# Patient Record
Sex: Female | Born: 1965 | Race: White | Hispanic: No | Marital: Married | Smoking: Never smoker
Health system: Southern US, Community
[De-identification: ages and names within clinical notes are randomized; demographics above are authoritative.]

## PROBLEM LIST (undated history)

## (undated) DIAGNOSIS — E119 Type 2 diabetes mellitus without complications: Secondary | ICD-10-CM

## (undated) DIAGNOSIS — R7303 Prediabetes: Secondary | ICD-10-CM

## (undated) DIAGNOSIS — R109 Unspecified abdominal pain: Secondary | ICD-10-CM

## (undated) DIAGNOSIS — D239 Other benign neoplasm of skin, unspecified: Secondary | ICD-10-CM

## (undated) HISTORY — DX: Other benign neoplasm of skin, unspecified: D23.9

## (undated) HISTORY — PX: COLONOSCOPY: SHX174

---

## 2004-09-08 ENCOUNTER — Ambulatory Visit: Payer: Self-pay

## 2009-07-03 ENCOUNTER — Ambulatory Visit: Payer: Self-pay

## 2014-01-15 ENCOUNTER — Ambulatory Visit: Payer: Self-pay | Admitting: Gastroenterology

## 2017-04-13 ENCOUNTER — Ambulatory Visit
Admission: RE | Admit: 2017-04-13 | Discharge: 2017-04-13 | Disposition: A | Discharge status: Home / Self Care | Payer: BLUE CROSS/BLUE SHIELD | Source: Ambulatory Visit | Attending: Internal Medicine | Admitting: Internal Medicine

## 2017-04-13 ENCOUNTER — Other Ambulatory Visit: Payer: Self-pay | Admitting: Internal Medicine

## 2017-04-13 DIAGNOSIS — R1011 Right upper quadrant pain: Secondary | ICD-10-CM

## 2017-04-13 DIAGNOSIS — R932 Abnormal findings on diagnostic imaging of liver and biliary tract: Secondary | ICD-10-CM | POA: Insufficient documentation

## 2017-04-13 DIAGNOSIS — R109 Unspecified abdominal pain: Secondary | ICD-10-CM

## 2017-04-13 DIAGNOSIS — K8020 Calculus of gallbladder without cholecystitis without obstruction: Secondary | ICD-10-CM | POA: Insufficient documentation

## 2017-04-13 HISTORY — DX: Unspecified abdominal pain: R10.9

## 2017-04-13 MED ORDER — TECHNETIUM TC 99M MEBROFENIN IV KIT
5.0000 | PACK | Freq: Once | INTRAVENOUS | Status: AC | PRN
  Administered 2017-04-13: 5.564 via INTRAVENOUS

## 2017-04-15 ENCOUNTER — Ambulatory Visit
Admission: RE | Admit: 2017-04-15 | Discharge: 2017-04-15 | Disposition: A | Discharge status: Home / Self Care | Payer: BLUE CROSS/BLUE SHIELD | Source: Ambulatory Visit | Attending: General Surgery | Admitting: General Surgery

## 2017-04-15 ENCOUNTER — Other Ambulatory Visit: Payer: Self-pay | Admitting: General Surgery

## 2017-04-15 DIAGNOSIS — K8020 Calculus of gallbladder without cholecystitis without obstruction: Secondary | ICD-10-CM

## 2017-04-15 DIAGNOSIS — K8000 Calculus of gallbladder with acute cholecystitis without obstruction: Secondary | ICD-10-CM | POA: Diagnosis present

## 2017-04-15 DIAGNOSIS — R17 Unspecified jaundice: Secondary | ICD-10-CM | POA: Diagnosis present

## 2017-04-15 MED ORDER — GADOBENATE DIMEGLUMINE 529 MG/ML IV SOLN
20.0000 mL | Freq: Once | INTRAVENOUS | Status: AC | PRN
  Administered 2017-04-15: 18 mL via INTRAVENOUS

## 2017-04-18 HISTORY — PX: ERCP: SHX60

## 2017-04-19 ENCOUNTER — Ambulatory Visit: Payer: Self-pay | Admitting: General Surgery

## 2017-04-19 ENCOUNTER — Other Ambulatory Visit: Payer: Self-pay

## 2017-04-19 ENCOUNTER — Encounter
Admission: RE | Admit: 2017-04-19 | Discharge: 2017-04-19 | Disposition: A | Discharge status: Home / Self Care | Payer: BLUE CROSS/BLUE SHIELD | Source: Ambulatory Visit | Attending: General Surgery | Admitting: General Surgery

## 2017-04-19 HISTORY — DX: Prediabetes: R73.03

## 2017-04-19 MED ORDER — CEFAZOLIN SODIUM-DEXTROSE 2-4 GM/100ML-% IV SOLN
2.0000 g | INTRAVENOUS | Status: AC
  Administered 2017-04-20: 2 g via INTRAVENOUS
  Filled 2017-04-19: qty 100

## 2017-04-19 NOTE — H&P (Signed)
PATIENT PROFILE: Tiffany [name] is a 52 y.o. female who presents to the Clinic for consultation at the request of Dr. Sabra Heck for evaluation of cholelithiasis.  PCP:  Yevonne Pax, MD  HISTORY OF PRESENT ILLNESS: [name] reports had abdominal pain that started 5 days ago. The pain is localized on the epigastric area and does not radiates to other part of the body. Paid is aggravated by food intake (fatty food). Pain is improved with ibuprofen sometimes. Patient refers that she has been having this pain during the past 6 months but this last one was more severe. This time pain was associated with nausea. Denies having yellow sclera but refers dark urine. Denies fever or chills.    Patient was evaluated by me on 04/15/17 and was found with choledocholithiasis. ERCP was coordinated at [facility] due to unavailability of ERCP at [facility] on the weekend. The procedure was cancelled on Friday as coordinated and done on Monday 04/18/17. Patient has been doing well. ERCP was completed with extraction of stone and sphincterotomy and papillotomy.    PROBLEM LIST:         Problem List  Date Reviewed: 04/13/2017         Noted   B12 deficiency 11/01/2016   Overview    258, 2018         GENERAL REVIEW OF SYSTEMS:   General ROS: negative for - chills, fever, weight gain or weight loss. Positive for fatigue Allergy and Immunology ROS: negative for - hives  Hematological and Lymphatic ROS: negative for - bleeding problems or bruising, negative for palpable nodes Endocrine ROS: negative for - heat or cold intolerance, hair changes Respiratory ROS: negative for - cough, shortness of breath or wheezing Cardiovascular ROS: no chest pain or palpitations GI ROS: negative for , constipation, vomiting. Positive for nausea, abdominal pain, diarrhea  Musculoskeletal ROS: negative for - joint swelling or muscle pain Neurological ROS: negative for - confusion, syncope. Positive for dizziness.   Dermatological ROS: negative for pruritus and rash Psychiatric: negative for anxiety, depression, difficulty sleeping and memory loss  MEDICATIONS: CurrentMedications        Current Outpatient Medications  Medication Sig Dispense Refill  . pantoprazole (PROTONIX) 40 MG DR tablet Take 1 tablet (40 mg total) by mouth once daily 30 tablet 0   No current facility-administered medications for this visit.       ALLERGIES: Patient has no known allergies.  PAST MEDICAL HISTORY: History reviewed. No pertinent past medical history.  PAST SURGICAL HISTORY:      Past Surgical History:  Procedure Laterality Date  . CESAREAN SECTION     x1  . COLONOSCOPY  08/14/2002 DKS   DKS  Normal/Repeat at age 52  . COLONOSCOPY  01/15/2014   Normal/Colon/Repeat 52yr/PYO     FAMILY HISTORY:      Family History  Problem Relation Age of Onset  . Thyroid disease Mother   . High blood pressure (Hypertension) Father   . Diabetes Brother      SOCIAL HISTORY: Social History          Socioeconomic History  . Marital status: Single    Spouse name: Not on file  . Number of children: Not on file  . Years of education: Not on file  . Highest education level: Not on file  Occupational History  . Not on file  Social Needs  . Financial resource strain: Not on file  . Food insecurity:    Worry: Not on file  Inability: Not on file  . Transportation needs:    Medical: Not on file    Non-medical: Not on file  Tobacco Use  . Smoking status: Never Smoker  . Smokeless tobacco: Never Used  Substance and Sexual Activity  . Alcohol use: Not Currently    Alcohol/week: 0.0 oz    Comment: on occasion  . Drug use: Never  . Sexual activity: Not Currently    Partners: Male  Other Topics Concern  . Not on file  Social History Narrative  . Not on file      PHYSICAL EXAM:    Vitals:   04/15/17 1105  BP: 128/79  Pulse: 85  Temp: 36.5 C (97.7 F)    Body mass index is 33.13 kg/m. Weight: 87.5 kg (193 lb)   GENERAL: Alert, active, oriented x3  HEENT: Pupils equal reactive to light. Extraocular movements are intact. Sclera clear. Palpebral conjunctiva normal red color.Pharynx clear.  NECK: Supple with no palpable mass and no adenopathy.  LUNGS: Sound clear with no rales rhonchi or wheezes.  HEART: Regular rhythm S1 and S2 without murmur.  ABDOMEN: Soft and depressible, nontender with no palpable mass, no hepatomegaly.   EXTREMITIES: Well-developed well-nourished symmetrical with no dependent edema.  NEUROLOGICAL: Awake alert oriented, facial expression symmetrical, moving all extremities.  REVIEW OF DATA: I have reviewed the following data today:      Office Visit on 04/13/2017  Component Date Value  . WBC (White Blood Cell Co* 04/13/2017 6.0   . RBC (Red Blood Cell Coun* 04/13/2017 5.07   . Hemoglobin 04/13/2017 15.1*  . Hematocrit 04/13/2017 45.1   . MCV (Mean Corpuscular Vo* 04/13/2017 89.0   . MCH (Mean Corpuscular He* 04/13/2017 29.8   . MCHC (Mean Corpuscular H* 04/13/2017 33.5   . Platelet Count 04/13/2017 211   . RDW-CV (Red Cell Distrib* 04/13/2017 12.7   . MPV (Mean Platelet Volum* 04/13/2017 10.1   . Neutrophils 04/13/2017 4.38   . Lymphocytes 04/13/2017 1.01   . Monocytes 04/13/2017 0.54   . Eosinophils 04/13/2017 0.06   . Basophils 04/13/2017 0.01   . Neutrophil % 04/13/2017 72.7*  . Lymphocyte % 04/13/2017 16.8   . Monocyte % 04/13/2017 9.0   . Eosinophil % 04/13/2017 1.0   . Basophil% 04/13/2017 0.2   . Immature Granulocyte % 04/13/2017 0.3   . Immature Granulocyte Cou* 04/13/2017 0.02   . Glucose 04/13/2017 128*  . Sodium 04/13/2017 138   . Potassium 04/13/2017 4.0   . Chloride 04/13/2017 102   . Carbon Dioxide (CO2) 04/13/2017 28.4   . Urea Nitrogen (BUN) 04/13/2017 14   . Creatinine 04/13/2017 0.9   . Glomerular Filtration Ra* 04/13/2017 65   . Calcium 04/13/2017 9.8   . AST   04/13/2017 378*  . ALT  04/13/2017 317*  . Alk Phos (alkaline Phosp* 04/13/2017 163*  . Albumin 04/13/2017 4.7   . Bilirubin, Total 04/13/2017 2.3*  . Protein, Total 04/13/2017 7.3   . A/G Ratio 04/13/2017 1.8   . Color 04/13/2017 Yellow   . Clarity 04/13/2017 Clear   . Specific Gravity 04/13/2017 1.020   . pH, Urine 04/13/2017 6.0   . Protein, Urinalysis 04/13/2017 Negative   . Glucose, Urinalysis 04/13/2017 Negative   . Ketones, Urinalysis 04/13/2017 Negative   . Blood, Urinalysis 04/13/2017 Moderate*  . Nitrite, Urinalysis 04/13/2017 Negative   . Leukocyte Esterase, Urin* 04/13/2017 Trace*  . White Blood Cells, Urina* 04/13/2017 0-3   . Red Blood Cells, Urinaly* 04/13/2017 4-10*  .  Bacteria, Urinalysis 04/13/2017 Few*  . Squamous Epithelial Cell* 04/13/2017 Few   . Lipase 04/13/2017 13      ASSESSMENT: Ms. Hurley is a 52 y.o. female presenting for consultation for cholelithiasis. After complete evaluation of patient history, physical and chart, choledocholithiasis was highly suspected. Even though the HIDA scan was negative, this is not the best study to evaluate the biliary ducts. Early in the morning before the patient was seen, the MRCP was coordinated to be able to evaluate the patient with a complete workup. After the MRCP was done and choledocholithiasis was confirmed, I contacted Dr. Allen Norris who is not on call and told me that he was not going to be available during the weekend and the next available ERCP was going to be on Monday. Since he is the only GI in the Hospital that does ERCP multiple GI's were called until finally one at [facility] accepted the case.   Patient was oriented about the diagnosis of cholelithiasis and choledocholithiasis. Also oriented about what is the gallbladder, its anatomy and function and the implications of having stones and more specifically having stone on the common bile duct. After multiple attempts to find a gastroenterologist who performs ERCP,  finally he was found in [place] and the case was accepted to be done Friday 04/15/17 but was finally done on Monday 04/18/17. I oriented the patient that even she was in no pain at that moment, I personally did not feel comfortable sending the patient home and waiting to have the GI doctor to perform an ERCP on Monday, and even admitting a patient to the hospital for observation until Monday I consider that is not the best treatment possible for the patient since I don't have a GI that does ERCP in my hospital during this weekend if the patient starts to deteriorate. For that reason, the patient was send to a GI that accepted the case. I told the patient that if the cholecystectomy can be performed over there after the procedure, it will be good, but if it is not done over there, I will be available to perform the cholecystectomy. Since they were not able to the the cholecystectomy, I will perform the procedure.   Surgical technique (open vs laparoscopic) was discussed. It was also discussed the goals of the surgery (decrease the pain episodes and avoid the risk of cholecystitis) and the risk of surgery including: bleeding, infection, common bile duct injury, stone retention, injury to other organs such as bowel, liver, stomach, other complications such as hernia, bowel obstruction among others. Also discussed with patient about anesthesia and its complications such as: reaction to medications, pneumonia, heart complications, death, among others.   PLAN: 1. Laparoscopic cholecystectomy  2. ERCP done 04/18/17  Patient verbalized understanding, all questions were answered, and were agreeable with the plan outlined above.     Herbert Pun, MD  Electronically signed by Herbert Pun, MD

## 2017-04-19 NOTE — Patient Instructions (Signed)
  Your procedure is scheduled on: 04-20-17 [facility] Report to [place] @ 6 AM-PT NOTIFIED OF THIS   Remember: Instructions that are not followed completely may result in serious medical risk, up to and including death, or upon the discretion of your surgeon and anesthesiologist your surgery may need to be rescheduled.    _x___ 1. Do not eat food after midnight the night before your procedure. NO GUM OR CANDY AFTER MIDNIGHT.  You may drink clear liquids up to 2 hours before you are scheduled to arrive at the hospital for your procedure.  Do not drink clear liquids within 2 hours of your scheduled arrival to the hospital.  Clear liquids include  --Water or Apple juice without pulp  --Clear carbohydrate beverage such as ClearFast or Gatorade  --Black Coffee or Clear Tea (No milk, no creamers, do not add anything to the coffee or Tea     __x__ 2. No Alcohol for 24 hours before or after surgery.   __x__3. No Smoking or e-cigarettes for 24 prior to surgery.  Do not use any chewable tobacco products for at least 6 hour prior to surgery   ____  4. Bring all medications with you on the day of surgery if instructed.    __x__ 5. Notify your doctor if there is any change in your medical condition     (cold, fever, infections).    x___6. On the morning of surgery brush your teeth with toothpaste and water.  You may rinse your mouth with mouth wash if you wish.  Do not swallow any toothpaste or mouthwash.   Do not wear jewelry, make-up, hairpins, clips or nail polish.  Do not wear lotions, powders, or perfumes. You may wear deodorant.  Do not shave 48 hours prior to surgery. Men may shave face and neck.  Do not bring valuables to the hospital.    [facility] is not responsible for any belongings or valuables.               Contacts, dentures or bridgework may not be worn into surgery.  Leave your suitcase in the car. After surgery it may be brought to your room.  For patients  admitted to the hospital, discharge time is determined by your treatment team.  _  Patients discharged the day of surgery will not be allowed to drive home.  You will need someone to drive you home and stay with you the night of your procedure.     ____ Take anti-hypertensive listed below, cardiac, seizure, asthma, anti-reflux and psychiatric medicines. These include:  1. NONE  2.  3.  4.  5.  6.  ____Fleets enema or Magnesium Citrate as directed.   _x___ Use CHG Soap or sage wipes as directed on instruction sheet   ____ Use inhalers on the day of surgery and bring to hospital day of surgery  ____ Stop Metformin and Janumet 2 days prior to surgery.    ____ Take 1/2 of usual insulin dose the night before surgery and none on the morning surgery.   ____ Follow recommendations from Cardiologist, Pulmonologist or PCP regarding stopping Aspirin, Coumadin, Plavix ,Eliquis, Effient, or Pradaxa, and Pletal.  X____Stop Anti-inflammatories such as Advil, Aleve, Ibuprofen, Motrin, Naproxen, Naprosyn, Goodies powders or aspirin products NOW-OK to take Tylenol    ____ Stop supplements until after surgery.    ____ Bring C-Pap to the hospital.

## 2017-04-19 NOTE — H&P (View-Only) (Signed)
PATIENT PROFILE: Tiffany [name] is a 52 y.o. female who presents to the Clinic for consultation at the request of Dr. Sabra Heck for evaluation of cholelithiasis.  PCP:  Yevonne Pax, MD  HISTORY OF PRESENT ILLNESS: Tiffany [name] reports had abdominal pain that started 5 days ago. The pain is localized on the epigastric area and does not radiates to other part of the body. Paid is aggravated by food intake (fatty food). Pain is improved with ibuprofen sometimes. Patient refers that she has been having this pain during the past 6 months but this last one was more severe. This time pain was associated with nausea. Denies having yellow sclera but refers dark urine. Denies fever or chills.    Patient was evaluated by me on 04/15/17 and was found with choledocholithiasis. ERCP was coordinated at [facility] due to unavailability of ERCP at [facility] on the weekend. The procedure was cancelled on Friday as coordinated and done on Monday 04/18/17. Patient has been doing well. ERCP was completed with extraction of stone and sphincterotomy and papillotomy.    PROBLEM LIST:         Problem List  Date Reviewed: 04/13/2017         Noted   B12 deficiency 11/01/2016   Overview    258, 2018         GENERAL REVIEW OF SYSTEMS:   General ROS: negative for - chills, fever, weight gain or weight loss. Positive for fatigue Allergy and Immunology ROS: negative for - hives  Hematological and Lymphatic ROS: negative for - bleeding problems or bruising, negative for palpable nodes Endocrine ROS: negative for - heat or cold intolerance, hair changes Respiratory ROS: negative for - cough, shortness of breath or wheezing Cardiovascular ROS: no chest pain or palpitations GI ROS: negative for , constipation, vomiting. Positive for nausea, abdominal pain, diarrhea  Musculoskeletal ROS: negative for - joint swelling or muscle pain Neurological ROS: negative for - confusion, syncope. Positive for dizziness.   Dermatological ROS: negative for pruritus and rash Psychiatric: negative for anxiety, depression, difficulty sleeping and memory loss  MEDICATIONS: CurrentMedications        Current Outpatient Medications  Medication Sig Dispense Refill  . pantoprazole (PROTONIX) 40 MG DR tablet Take 1 tablet (40 mg total) by mouth once daily 30 tablet 0   No current facility-administered medications for this visit.       ALLERGIES: Patient has no known allergies.  PAST MEDICAL HISTORY: History reviewed. No pertinent past medical history.  PAST SURGICAL HISTORY:      Past Surgical History:  Procedure Laterality Date  . CESAREAN SECTION     x1  . COLONOSCOPY  08/14/2002 DKS   DKS  Normal/Repeat at age 58  . COLONOSCOPY  01/15/2014   Normal/Colon/Repeat 21yr/PYO     FAMILY HISTORY:      Family History  Problem Relation Age of Onset  . Thyroid disease Mother   . High blood pressure (Hypertension) Father   . Diabetes Brother      SOCIAL HISTORY: Social History          Socioeconomic History  . Marital status: Single    Spouse name: Not on file  . Number of children: Not on file  . Years of education: Not on file  . Highest education level: Not on file  Occupational History  . Not on file  Social Needs  . Financial resource strain: Not on file  . Food insecurity:    Worry: Not on file  Inability: Not on file  . Transportation needs:    Medical: Not on file    Non-medical: Not on file  Tobacco Use  . Smoking status: Never Smoker  . Smokeless tobacco: Never Used  Substance and Sexual Activity  . Alcohol use: Not Currently    Alcohol/week: 0.0 oz    Comment: on occasion  . Drug use: Never  . Sexual activity: Not Currently    Partners: Male  Other Topics Concern  . Not on file  Social History Narrative  . Not on file      PHYSICAL EXAM:    Vitals:   04/15/17 1105  BP: 128/79  Pulse: 85  Temp: 36.5 C (97.7 F)    Body mass index is 33.13 kg/m. Weight: 87.5 kg (193 lb)   GENERAL: Alert, active, oriented x3  HEENT: Pupils equal reactive to light. Extraocular movements are intact. Sclera clear. Palpebral conjunctiva normal red color.Pharynx clear.  NECK: Supple with no palpable mass and no adenopathy.  LUNGS: Sound clear with no rales rhonchi or wheezes.  HEART: Regular rhythm S1 and S2 without murmur.  ABDOMEN: Soft and depressible, nontender with no palpable mass, no hepatomegaly.   EXTREMITIES: Well-developed well-nourished symmetrical with no dependent edema.  NEUROLOGICAL: Awake alert oriented, facial expression symmetrical, moving all extremities.  REVIEW OF DATA: I have reviewed the following data today:      Office Visit on 04/13/2017  Component Date Value  . WBC (White Blood Cell Co* 04/13/2017 6.0   . RBC (Red Blood Cell Coun* 04/13/2017 5.07   . Hemoglobin 04/13/2017 15.1*  . Hematocrit 04/13/2017 45.1   . MCV (Mean Corpuscular Vo* 04/13/2017 89.0   . MCH (Mean Corpuscular He* 04/13/2017 29.8   . MCHC (Mean Corpuscular H* 04/13/2017 33.5   . Platelet Count 04/13/2017 211   . RDW-CV (Red Cell Distrib* 04/13/2017 12.7   . MPV (Mean Platelet Volum* 04/13/2017 10.1   . Neutrophils 04/13/2017 4.38   . Lymphocytes 04/13/2017 1.01   . Monocytes 04/13/2017 0.54   . Eosinophils 04/13/2017 0.06   . Basophils 04/13/2017 0.01   . Neutrophil % 04/13/2017 72.7*  . Lymphocyte % 04/13/2017 16.8   . Monocyte % 04/13/2017 9.0   . Eosinophil % 04/13/2017 1.0   . Basophil% 04/13/2017 0.2   . Immature Granulocyte % 04/13/2017 0.3   . Immature Granulocyte Cou* 04/13/2017 0.02   . Glucose 04/13/2017 128*  . Sodium 04/13/2017 138   . Potassium 04/13/2017 4.0   . Chloride 04/13/2017 102   . Carbon Dioxide (CO2) 04/13/2017 28.4   . Urea Nitrogen (BUN) 04/13/2017 14   . Creatinine 04/13/2017 0.9   . Glomerular Filtration Ra* 04/13/2017 65   . Calcium 04/13/2017 9.8   . AST   04/13/2017 378*  . ALT  04/13/2017 317*  . Alk Phos (alkaline Phosp* 04/13/2017 163*  . Albumin 04/13/2017 4.7   . Bilirubin, Total 04/13/2017 2.3*  . Protein, Total 04/13/2017 7.3   . A/G Ratio 04/13/2017 1.8   . Color 04/13/2017 Yellow   . Clarity 04/13/2017 Clear   . Specific Gravity 04/13/2017 1.020   . pH, Urine 04/13/2017 6.0   . Protein, Urinalysis 04/13/2017 Negative   . Glucose, Urinalysis 04/13/2017 Negative   . Ketones, Urinalysis 04/13/2017 Negative   . Blood, Urinalysis 04/13/2017 Moderate*  . Nitrite, Urinalysis 04/13/2017 Negative   . Leukocyte Esterase, Urin* 04/13/2017 Trace*  . White Blood Cells, Urina* 04/13/2017 0-3   . Red Blood Cells, Urinaly* 04/13/2017 4-10*  .  Bacteria, Urinalysis 04/13/2017 Few*  . Squamous Epithelial Cell* 04/13/2017 Few   . Lipase 04/13/2017 13      ASSESSMENT: Tiffany [name] is a 52 y.o. female presenting for consultation for cholelithiasis. After complete evaluation of patient history, physical and chart, choledocholithiasis was highly suspected. Even though the HIDA scan was negative, this is not the best study to evaluate the biliary ducts. Early in the morning before the patient was seen, the MRCP was coordinated to be able to evaluate the patient with a complete workup. After the MRCP was done and choledocholithiasis was confirmed, I contacted Dr. Allen Norris who is not on call and told me that he was not going to be available during the weekend and the next available ERCP was going to be on Monday. Since he is the only GI in the Hospital that does ERCP multiple GI's were called until finally one at [facility] accepted the case.   Patient was oriented about the diagnosis of cholelithiasis and choledocholithiasis. Also oriented about what is the gallbladder, its anatomy and function and the implications of having stones and more specifically having stone on the common bile duct. After multiple attempts to find a gastroenterologist who performs ERCP,  finally he was found in [place] and the case was accepted to be done Friday 04/15/17 but was finally done on Monday 04/18/17. I oriented the patient that even she was in no pain at that moment, I personally did not feel comfortable sending the patient home and waiting to have the GI doctor to perform an ERCP on Monday, and even admitting a patient to the hospital for observation until Monday I consider that is not the best treatment possible for the patient since I don't have a GI that does ERCP in my hospital during this weekend if the patient starts to deteriorate. For that reason, the patient was send to a GI that accepted the case. I told the patient that if the cholecystectomy can be performed over there after the procedure, it will be good, but if it is not done over there, I will be available to perform the cholecystectomy. Since they were not able to the the cholecystectomy, I will perform the procedure.   Surgical technique (open vs laparoscopic) was discussed. It was also discussed the goals of the surgery (decrease the pain episodes and avoid the risk of cholecystitis) and the risk of surgery including: bleeding, infection, common bile duct injury, stone retention, injury to other organs such as bowel, liver, stomach, other complications such as hernia, bowel obstruction among others. Also discussed with patient about anesthesia and its complications such as: reaction to medications, pneumonia, heart complications, death, among others.   PLAN: 1. Laparoscopic cholecystectomy  2. ERCP done 04/18/17  Patient verbalized understanding, all questions were answered, and were agreeable with the plan outlined above.     Herbert Pun, MD  Electronically signed by Herbert Pun, MD

## 2017-04-20 ENCOUNTER — Other Ambulatory Visit: Payer: Self-pay

## 2017-04-20 ENCOUNTER — Ambulatory Visit: Payer: BLUE CROSS/BLUE SHIELD | Admitting: Anesthesiology

## 2017-04-20 ENCOUNTER — Encounter
Admission: RE | Disposition: A | Discharge status: Home / Self Care | Payer: Self-pay | Source: Ambulatory Visit | Attending: General Surgery

## 2017-04-20 ENCOUNTER — Ambulatory Visit
Admission: RE | Admit: 2017-04-20 | Discharge: 2017-04-20 | Disposition: A | Discharge status: Home / Self Care | Payer: BLUE CROSS/BLUE SHIELD | Source: Ambulatory Visit | Attending: General Surgery | Admitting: General Surgery

## 2017-04-20 ENCOUNTER — Encounter: Payer: Self-pay | Admitting: *Deleted

## 2017-04-20 DIAGNOSIS — E538 Deficiency of other specified B group vitamins: Secondary | ICD-10-CM | POA: Diagnosis not present

## 2017-04-20 DIAGNOSIS — K8010 Calculus of gallbladder with chronic cholecystitis without obstruction: Secondary | ICD-10-CM | POA: Insufficient documentation

## 2017-04-20 HISTORY — PX: CHOLECYSTECTOMY: SHX55

## 2017-04-20 LAB — POCT PREGNANCY, URINE: PREG TEST UR: NEGATIVE

## 2017-04-20 LAB — GLUCOSE, CAPILLARY
GLUCOSE-CAPILLARY: 189 mg/dL — AB (ref 65–99)
Glucose-Capillary: 141 mg/dL — ABNORMAL HIGH (ref 65–99)

## 2017-04-20 SURGERY — LAPAROSCOPIC CHOLECYSTECTOMY
Anesthesia: General | Site: Abdomen | Wound class: Clean Contaminated

## 2017-04-20 MED ORDER — KETOROLAC TROMETHAMINE 30 MG/ML IJ SOLN
INTRAMUSCULAR | Status: AC
  Filled 2017-04-20: qty 1

## 2017-04-20 MED ORDER — BUPIVACAINE-EPINEPHRINE (PF) 0.5% -1:200000 IJ SOLN
INTRAMUSCULAR | Status: AC
  Filled 2017-04-20: qty 30

## 2017-04-20 MED ORDER — SODIUM CHLORIDE 0.9 % IV SOLN
INTRAVENOUS | Status: DC
  Administered 2017-04-20: 07:00:00 via INTRAVENOUS

## 2017-04-20 MED ORDER — CEFAZOLIN SODIUM-DEXTROSE 2-3 GM-%(50ML) IV SOLR
INTRAVENOUS | Status: AC
  Filled 2017-04-20: qty 50

## 2017-04-20 MED ORDER — PHENYLEPHRINE HCL 10 MG/ML IJ SOLN
INTRAMUSCULAR | Status: AC
  Filled 2017-04-20: qty 1

## 2017-04-20 MED ORDER — FENTANYL CITRATE (PF) 100 MCG/2ML IJ SOLN: 25.0000 ug | INTRAMUSCULAR | Status: DC | PRN

## 2017-04-20 MED ORDER — SUGAMMADEX SODIUM 200 MG/2ML IV SOLN
INTRAVENOUS | Status: DC | PRN
  Administered 2017-04-20: 174.2 mg via INTRAVENOUS

## 2017-04-20 MED ORDER — GLYCOPYRROLATE 0.2 MG/ML IJ SOLN
INTRAMUSCULAR | Status: DC | PRN
  Administered 2017-04-20: 0.2 mg via INTRAVENOUS

## 2017-04-20 MED ORDER — DEXMEDETOMIDINE HCL 200 MCG/2ML IV SOLN
INTRAVENOUS | Status: DC | PRN
  Administered 2017-04-20: 8 ug via INTRAVENOUS

## 2017-04-20 MED ORDER — FENTANYL CITRATE (PF) 100 MCG/2ML IJ SOLN
INTRAMUSCULAR | Status: DC | PRN
  Administered 2017-04-20: 100 ug via INTRAVENOUS

## 2017-04-20 MED ORDER — ONDANSETRON HCL 4 MG/2ML IJ SOLN
INTRAMUSCULAR | Status: DC | PRN
  Administered 2017-04-20: 4 mg via INTRAVENOUS

## 2017-04-20 MED ORDER — ROCURONIUM BROMIDE 100 MG/10ML IV SOLN
INTRAVENOUS | Status: DC | PRN
  Administered 2017-04-20: 30 mg via INTRAVENOUS

## 2017-04-20 MED ORDER — OXYCODONE HCL 5 MG/5ML PO SOLN: 5.0000 mg | Freq: Once | ORAL | Status: AC | PRN

## 2017-04-20 MED ORDER — GLYCOPYRROLATE 0.2 MG/ML IJ SOLN
INTRAMUSCULAR | Status: AC
  Filled 2017-04-20: qty 1

## 2017-04-20 MED ORDER — DIPHENHYDRAMINE HCL 50 MG/ML IJ SOLN
INTRAMUSCULAR | Status: AC
  Filled 2017-04-20: qty 1

## 2017-04-20 MED ORDER — FENTANYL CITRATE (PF) 100 MCG/2ML IJ SOLN
INTRAMUSCULAR | Status: AC
  Filled 2017-04-20: qty 2

## 2017-04-20 MED ORDER — LIDOCAINE HCL (PF) 2 % IJ SOLN
INTRAMUSCULAR | Status: AC
  Filled 2017-04-20: qty 10

## 2017-04-20 MED ORDER — LACTATED RINGERS IV SOLN: INTRAVENOUS | Status: DC

## 2017-04-20 MED ORDER — SUCCINYLCHOLINE CHLORIDE 20 MG/ML IJ SOLN
INTRAMUSCULAR | Status: AC
  Filled 2017-04-20: qty 1

## 2017-04-20 MED ORDER — OXYCODONE HCL 5 MG PO TABS
5.0000 mg | ORAL_TABLET | Freq: Once | ORAL | Status: AC | PRN
  Administered 2017-04-20: 5 mg via ORAL

## 2017-04-20 MED ORDER — PROPOFOL 10 MG/ML IV BOLUS
INTRAVENOUS | Status: DC | PRN
  Administered 2017-04-20: 200 mg via INTRAVENOUS

## 2017-04-20 MED ORDER — FAMOTIDINE 20 MG PO TABS
20.0000 mg | ORAL_TABLET | Freq: Once | ORAL | Status: AC
  Administered 2017-04-20: 20 mg via ORAL

## 2017-04-20 MED ORDER — MIDAZOLAM HCL 5 MG/5ML IJ SOLN
INTRAMUSCULAR | Status: DC | PRN
  Administered 2017-04-20: 2 mg via INTRAVENOUS

## 2017-04-20 MED ORDER — MIDAZOLAM HCL 2 MG/2ML IJ SOLN
INTRAMUSCULAR | Status: AC
  Filled 2017-04-20: qty 2

## 2017-04-20 MED ORDER — DEXAMETHASONE SODIUM PHOSPHATE 10 MG/ML IJ SOLN
INTRAMUSCULAR | Status: AC
  Filled 2017-04-20: qty 1

## 2017-04-20 MED ORDER — LIDOCAINE HCL (CARDIAC) 20 MG/ML IV SOLN
INTRAVENOUS | Status: DC | PRN
  Administered 2017-04-20: 100 mg via INTRAVENOUS

## 2017-04-20 MED ORDER — DIPHENHYDRAMINE HCL 50 MG/ML IJ SOLN
INTRAMUSCULAR | Status: DC | PRN
  Administered 2017-04-20: 12.5 mg via INTRAVENOUS

## 2017-04-20 MED ORDER — OXYCODONE HCL 5 MG PO TABS
ORAL_TABLET | ORAL | Status: AC
  Filled 2017-04-20: qty 1

## 2017-04-20 MED ORDER — KETOROLAC TROMETHAMINE 30 MG/ML IJ SOLN
INTRAMUSCULAR | Status: DC | PRN
  Administered 2017-04-20: 30 mg via INTRAVENOUS

## 2017-04-20 MED ORDER — ROCURONIUM BROMIDE 50 MG/5ML IV SOLN
INTRAVENOUS | Status: AC
  Filled 2017-04-20: qty 1

## 2017-04-20 MED ORDER — HYDROCODONE-ACETAMINOPHEN 5-325 MG PO TABS
1.0000 | ORAL_TABLET | ORAL | 0 refills | Status: AC | PRN
Start: 2017-04-20 — End: 2017-04-23

## 2017-04-20 MED ORDER — ONDANSETRON HCL 4 MG/2ML IJ SOLN
INTRAMUSCULAR | Status: AC
  Filled 2017-04-20: qty 2

## 2017-04-20 MED ORDER — BUPIVACAINE-EPINEPHRINE (PF) 0.5% -1:200000 IJ SOLN
INTRAMUSCULAR | Status: DC | PRN
  Administered 2017-04-20: 15 mL via PERINEURAL

## 2017-04-20 MED ORDER — PROPOFOL 10 MG/ML IV BOLUS
INTRAVENOUS | Status: AC
  Filled 2017-04-20: qty 40

## 2017-04-20 MED ORDER — PHENYLEPHRINE HCL 10 MG/ML IJ SOLN
INTRAMUSCULAR | Status: DC | PRN
  Administered 2017-04-20: 50 ug via INTRAVENOUS
  Administered 2017-04-20 (×2): 100 ug via INTRAVENOUS
  Administered 2017-04-20: 150 ug via INTRAVENOUS
  Administered 2017-04-20 (×3): 100 ug via INTRAVENOUS

## 2017-04-20 MED ORDER — FAMOTIDINE 20 MG PO TABS
ORAL_TABLET | ORAL | Status: AC
  Administered 2017-04-20: 20 mg via ORAL
  Filled 2017-04-20: qty 1

## 2017-04-20 SURGICAL SUPPLY — 41 items
APPLIER CLIP 5 13 M/L LIGAMAX5 (MISCELLANEOUS) ×3
APPLIER CLIP LOGIC TI 5 (MISCELLANEOUS) IMPLANT
BLADE SURG SZ11 CARB STEEL (BLADE) ×3 IMPLANT
CANISTER SUCT 1200ML W/VALVE (MISCELLANEOUS) ×3 IMPLANT
CANISTER SUCT 3000ML (MISCELLANEOUS) ×3 IMPLANT
CHLORAPREP W/TINT 26ML (MISCELLANEOUS) ×3 IMPLANT
CLIP APPLIE 5 13 M/L LIGAMAX5 (MISCELLANEOUS) ×1 IMPLANT
DERMABOND ADVANCED (GAUZE/BANDAGES/DRESSINGS) ×2
DERMABOND ADVANCED .7 DNX12 (GAUZE/BANDAGES/DRESSINGS) ×1 IMPLANT
DRSG TEGADERM 2-3/8X2-3/4 SM (GAUZE/BANDAGES/DRESSINGS) ×3 IMPLANT
DRSG TEGADERM 4X4.75 (GAUZE/BANDAGES/DRESSINGS) ×3 IMPLANT
ELECT REM PT RETURN 9FT ADLT (ELECTROSURGICAL) ×3
ELECTRODE REM PT RTRN 9FT ADLT (ELECTROSURGICAL) ×1 IMPLANT
GLOVE BIO SURGEON STRL SZ 6.5 (GLOVE) ×8 IMPLANT
GLOVE BIO SURGEONS STRL SZ 6.5 (GLOVE) ×4
GOWN STRL REUS W/ TWL LRG LVL3 (GOWN DISPOSABLE) ×4 IMPLANT
GOWN STRL REUS W/TWL LRG LVL3 (GOWN DISPOSABLE) ×8
GRASPER SUT TROCAR 14GX15 (MISCELLANEOUS) ×3 IMPLANT
HEMOSTAT SURGICEL 2X3 (HEMOSTASIS) IMPLANT
IRRIGATION STRYKERFLOW (MISCELLANEOUS) ×1 IMPLANT
IRRIGATOR STRYKERFLOW (MISCELLANEOUS) ×3
IV NS 1000ML (IV SOLUTION) ×4
IV NS 1000ML BAXH (IV SOLUTION) ×2 IMPLANT
KIT TURNOVER KIT A (KITS) ×3 IMPLANT
LABEL OR SOLS (LABEL) ×3 IMPLANT
NEEDLE HYPO 25X1 1.5 SAFETY (NEEDLE) ×3 IMPLANT
NEEDLE INSUFFLATION 14GA 120MM (NEEDLE) ×3 IMPLANT
NS IRRIG 500ML POUR BTL (IV SOLUTION) ×3 IMPLANT
PACK LAP CHOLECYSTECTOMY (MISCELLANEOUS) ×3 IMPLANT
PENCIL ELECTRO HAND CTR (MISCELLANEOUS) ×3 IMPLANT
POUCH SPECIMEN RETRIEVAL 10MM (ENDOMECHANICALS) ×3 IMPLANT
SCISSORS METZENBAUM CVD 33 (INSTRUMENTS) ×3 IMPLANT
SLEEVE ENDOPATH XCEL 5M (ENDOMECHANICALS) ×6 IMPLANT
SPONGE GAUZE 2X2 8PLY STER LF (GAUZE/BANDAGES/DRESSINGS) ×1
SPONGE GAUZE 2X2 8PLY STRL LF (GAUZE/BANDAGES/DRESSINGS) ×2 IMPLANT
SUT MNCRL AB 4-0 PS2 18 (SUTURE) ×6 IMPLANT
SUT VIC AB 0 CT1 36 (SUTURE) ×3 IMPLANT
SUT VICRYL 0 AB UR-6 (SUTURE) ×3 IMPLANT
TROCAR XCEL NON-BLD 11X100MML (ENDOMECHANICALS) ×3 IMPLANT
TROCAR XCEL NON-BLD 5MMX100MML (ENDOMECHANICALS) ×3 IMPLANT
TUBING INSUFFLATION (TUBING) ×3 IMPLANT

## 2017-04-20 NOTE — Brief Op Note (Signed)
04/20/2017  9:52 AM  PATIENT:  Tiffany [name]  52 y.o. female  PRE-OPERATIVE DIAGNOSIS:  CALCULUS OF GALLBLADDER AND HISTORY OF CHOLEDOCHOLITHIASIS  [name]-OPERATIVE DIAGNOSIS:  CALCULUS OF GALLBLADDER AND HISTORY OF CHOLEDOCHOLITHIASIS  PROCEDURE:  Procedure(s): LAPAROSCOPIC CHOLECYSTECTOMY (N/A)  SURGEON:  Surgeon(s) and Role:    * Herbert Pun, MD - Primary  ANESTHESIA:   local and general  EBL:  15 mL

## 2017-04-20 NOTE — Interval H&P Note (Signed)
History and Physical Interval Note:  04/20/2017 6:46 AM  [name]  has presented today for surgery, with the diagnosis of CALCULUS OF GALLBLADDER, status post ERCP due to choledocholithiasis.  The various methods of treatment have been discussed with the patient and family. After consideration of risks, benefits and other options for treatment, the patient has consented to  Procedure(s): LAPAROSCOPIC CHOLECYSTECTOMY (N/A) as a surgical intervention .  The patient's history has been reviewed, patient examined, no change in status, stable for surgery.  I have reviewed the patient's chart and labs.  Questions were answered to the patient's satisfaction.     Tiffany [name]

## 2017-04-20 NOTE — OR Nursing (Signed)
Discussed discharge instructions with pt and family. All voice understanding.

## 2017-04-20 NOTE — Transfer of Care (Signed)
Immediate Anesthesia Transfer of Care Note  Patient: Tiffany [name]  Procedure(s) Performed: LAPAROSCOPIC CHOLECYSTECTOMY (N/A Abdomen)  Patient Location: PACU  Anesthesia Type:General  Level of Consciousness: awake, alert  and oriented  Airway & Oxygen Therapy: Patient Spontanous Breathing and Patient connected to face mask oxygen  Post-op Assessment: Report given to RN and Post -op Vital signs reviewed and stable  Post vital signs: Reviewed and stable  Last Vitals:  Vitals Value Taken Time  BP 135/82 04/20/2017  9:31 AM  Temp 37.1 C 04/20/2017  9:31 AM  Pulse 93 04/20/2017  9:31 AM  Resp 19 04/20/2017  9:31 AM  SpO2 100 % 04/20/2017  9:31 AM  Vitals shown include unvalidated device data.  Last Pain:  Vitals:   04/20/17 0605  TempSrc: Oral         Complications: No apparent anesthesia complications

## 2017-04-20 NOTE — Anesthesia Preprocedure Evaluation (Signed)
Anesthesia Evaluation  Patient identified by MRN, date of birth, ID band Patient awake    Reviewed: Allergy & Precautions, H&P , NPO status , Patient's Chart, lab work & pertinent test results  History of Anesthesia Complications Negative for: history of anesthetic complications  Airway Mallampati: II  TM Distance: >3 FB Neck ROM: full    Dental  (+) Chipped   Pulmonary neg pulmonary ROS, neg shortness of breath,           Cardiovascular Exercise Tolerance: Good (-) angina(-) Past MI negative cardio ROS       Neuro/Psych negative neurological ROS  negative psych ROS   GI/Hepatic negative GI ROS, Neg liver ROS,   Endo/Other  diabetes, Type 2  Renal/GU      Musculoskeletal   Abdominal   Peds  Hematology negative hematology ROS (+)   Anesthesia Other Findings Past Medical History: 04/13/2017: Abdominal pain No date: Pre-diabetes  Past Surgical History: No date: CESAREAN SECTION No date: COLONOSCOPY 04/18/2017: ERCP  BMI    Body Mass Index:  32.96 kg/m      Reproductive/Obstetrics negative OB ROS                             Anesthesia Physical Anesthesia Plan  ASA: III  Anesthesia Plan: General ETT   Post-op Pain Management:    Induction: Intravenous  PONV Risk Score and Plan: Dexamethasone, Ondansetron and Midazolam  Airway Management Planned: Oral ETT  Additional Equipment:   Intra-op Plan:   Post-operative Plan: Extubation in OR  Informed Consent: I have reviewed the patients History and Physical, chart, labs and discussed the procedure including the risks, benefits and alternatives for the proposed anesthesia with the patient or authorized representative who has indicated his/her understanding and acceptance.   Dental Advisory Given  Plan Discussed with: Anesthesiologist, CRNA and Surgeon  Anesthesia Plan Comments: (Patient consented for risks of anesthesia  including but not limited to:  - adverse reactions to medications - damage to teeth, lips or other oral mucosa - sore throat or hoarseness - Damage to heart, brain, lungs or loss of life  Patient voiced understanding.)        Anesthesia Quick Evaluation

## 2017-04-20 NOTE — Anesthesia Procedure Notes (Signed)
Procedure Name: Intubation Date/Time: 04/20/2017 7:31 AM Performed by: Andria Frames, MD Pre-anesthesia Checklist: Patient identified, Emergency Drugs available, Suction available and Patient being monitored Patient Re-evaluated:Patient Re-evaluated prior to induction Oxygen Delivery Method: Circle system utilized Preoxygenation: Pre-oxygenation with 100% oxygen Induction Type: IV induction Ventilation: Mask ventilation without difficulty Laryngoscope Size: Miller and 2 Grade View: Grade I Tube type: Oral Tube size: 7.0 mm Number of attempts: 1 Airway Equipment and Method: Patient positioned with wedge pillow and Stylet Placement Confirmation: ETT inserted through vocal cords under direct vision,  positive ETCO2 and breath sounds checked- equal and bilateral Secured at: 22 cm Tube secured with: Tape Dental Injury: Teeth and Oropharynx as per pre-operative assessment

## 2017-04-20 NOTE — Anesthesia Post-op Follow-up Note (Signed)
Anesthesia QCDR form completed.        

## 2017-04-20 NOTE — Discharge Instructions (Signed)
°  Diet: Resume home heart healthy regular diet.   Activity: No heavy lifting >20 pounds (children, pets, laundry, garbage) or strenuous activity until follow-up, but light activity and walking are encouraged. Do not drive or drink alcohol if taking narcotic pain medications.  Wound care: Remove dressing before next shower. May shower with soapy water and pat dry (do not rub incisions), but no baths or submerging incision underwater until follow-up. (no swimming)   Medications: Resume all home medications. For mild to moderate pain: acetaminophen (Tylenol) or ibuprofen (if no kidney disease). Combining Tylenol with alcohol can substantially increase your risk of causing liver disease. Narcotic pain medications, if prescribed, can be used for severe pain, though may cause nausea, constipation, and drowsiness. Do not combine Tylenol and Norco within a 6 hour period as Norco contains Tylenol. If you do not need the narcotic pain medication, you do not need to fill the prescription.  Call office [phone]) at any time if any questions, worsening pain, fevers/chills, bleeding, drainage from incision site, or other concerns.    AMBULATORY SURGERY  DISCHARGE INSTRUCTIONS   1) The drugs that you were given will stay in your system until tomorrow so for the next 24 hours you should not:  A) Drive an automobile B) Make any legal decisions C) Drink any alcoholic beverage   2) You may resume regular meals tomorrow.  Today it is better to start with liquids and gradually work up to solid foods.  You may eat anything you prefer, but it is better to start with liquids, then soup and crackers, and gradually work up to solid foods.   3) Please notify your doctor immediately if you have any unusual bleeding, trouble breathing, redness and pain at the surgery site, drainage, fever, or pain not relieved by medication.    4) Additional Instructions:        Please contact your physician with any  problems or Same Day Surgery at [phone], Monday through Friday 6 am to 4 pm, or [place] at [facility] number at [phone].       appt with Dr Windell Moment  May 2 at 4:00pm

## 2017-04-20 NOTE — Op Note (Signed)
Preoperative diagnosis: Symptomatic cholelithiasis and history of choledocholithiasis  Postoperative diagnosis: Symptomatic cholelithiasis and history of choledocholithiasis.  Procedure: Laparoscopic Cholecystectomy.   Anesthesia: GETA   Surgeon: Dr. Windell Moment  Wound Classification: Clean Contaminated  Indications: Patient is a 52 y.o. female developed right upper quadrant pain and nausea and on workup was found to have cholelithiasis with a normal common duct and choledocholithiasis. ERCP was performed with succesful removal of CBD stone. Laparoscopic cholecystectomy was elected.  Findings: Critical view of safety achieved Cystic duct and artery identified, ligated and divided Adequate hemostasis  Description of procedure: The patient was placed on the operating table in the supine position. General anesthesia was induced. A time-out was completed verifying correct patient, procedure, site, positioning, and implant(s) and/or special equipment prior to beginning this procedure. An orogastric tube was placed. The abdomen was prepped and draped in the usual sterile fashion.  An incision was made in a natural skin line above the umbilicus.  The fascia was elevated and the Veress needle inserted. Proper position was confirmed by aspiration and saline meniscus test.  The abdomen was insufflated with carbon dioxide to a pressure of 15 mmHg. The patient tolerated insufflation well. A 11-mm trocar was then inserted.  The laparoscope was inserted and the abdomen inspected. No injuries from initial trocar placement were noted. Additional trocars were then inserted in the following locations: a 5-mm trocar in the right epigastrium and two 5-mm trocars along the right costal margin. The abdomen was inspected and no abnormalities were found. The table was placed in the reverse Trendelenburg position with the right side up.  Filmy adhesions between the gallbladder and omentum, duodenum and transverse  colon were lysed sharply. The dome of the gallbladder was grasped with an atraumatic grasper passed through the lateral port and retracted over the dome of the liver. The infundibulum was also grasped with an atraumatic grasper through the midclavicular port and retracted toward the right lower quadrant. This maneuver exposed Calot's triangle. The peritoneum overlying the gallbladder infundibulum was then incised and the cystic duct and cystic artery identified and circumferentially dissected. The critical view of safety was reviewed being able to see the liver inside the critical view of safety. The cystic duct and cystic artery were then doubly clipped and divided close to the gallbladder.  The gallbladder was then dissected from its peritoneal attachments by electrocautery. Hemostasis was checked and the gallbladder and contained stones were removed using an endoscopic retrieval bag placed through the umbilical port. The gallbladder was passed off the table as a specimen. The gallbladder fossa was copiously irrigated with saline and hemostasis was obtained. There was no evidence of bleeding from the gallbladder fossa or cystic artery or leakage of the bile from the cystic duct stump. Secondary trocars were removed under direct vision. No bleeding was noted. The laparoscope was withdrawn and the umbilical trocar removed. The abdomen was allowed to collapse. The fascia of the 100mm trocar sites was closed with figure-of-eight 0 vicryl sutures. The skin was closed with subcuticular sutures of 4-0 monocryl and topical skin adhesive. The orogastric tube was removed.  The patient tolerated the procedure well and was taken to the postanesthesia care unit in stable condition.   Specimen: Gallbladder  Complications: None  EBL: 15 mL

## 2017-04-20 NOTE — Anesthesia Postprocedure Evaluation (Signed)
Anesthesia Post Note  Patient: Tiffany [name]  Procedure(s) Performed: LAPAROSCOPIC CHOLECYSTECTOMY (N/A Abdomen)  Patient location during evaluation: PACU Anesthesia Type: General Level of consciousness: awake and alert Pain management: pain level controlled Vital Signs Assessment: post-procedure vital signs reviewed and stable Respiratory status: spontaneous breathing, nonlabored ventilation, respiratory function stable and patient connected to nasal cannula oxygen Cardiovascular status: blood pressure returned to baseline and stable Postop Assessment: no apparent nausea or vomiting Anesthetic complications: no     Last Vitals:  Vitals:   04/20/17 1001 04/20/17 1016  BP: 138/76 139/86  Pulse: 89 93  Resp: 16 17  Temp:    SpO2: 97% 96%    Last Pain:  Vitals:   04/20/17 1016  TempSrc:   PainSc: 4                  Deltha Bernales K Gyselle Matthew

## 2017-04-21 LAB — SURGICAL PATHOLOGY

## 2019-08-22 ENCOUNTER — Other Ambulatory Visit: Payer: Self-pay

## 2019-08-22 ENCOUNTER — Ambulatory Visit (INDEPENDENT_AMBULATORY_CARE_PROVIDER_SITE_OTHER): Payer: BC Managed Care – PPO | Admitting: Dermatology

## 2019-08-22 DIAGNOSIS — D485 Neoplasm of uncertain behavior of skin: Secondary | ICD-10-CM

## 2019-08-22 DIAGNOSIS — L719 Rosacea, unspecified: Secondary | ICD-10-CM | POA: Diagnosis not present

## 2019-08-22 DIAGNOSIS — D2339 Other benign neoplasm of skin of other parts of face: Secondary | ICD-10-CM | POA: Diagnosis not present

## 2019-08-22 DIAGNOSIS — Z1283 Encounter for screening for malignant neoplasm of skin: Secondary | ICD-10-CM

## 2019-08-22 DIAGNOSIS — D2239 Melanocytic nevi of other parts of face: Secondary | ICD-10-CM

## 2019-08-22 DIAGNOSIS — D1800 Hemangioma unspecified site: Secondary | ICD-10-CM

## 2019-08-22 DIAGNOSIS — D229 Melanocytic nevi, unspecified: Secondary | ICD-10-CM

## 2019-08-22 DIAGNOSIS — L820 Inflamed seborrheic keratosis: Secondary | ICD-10-CM | POA: Diagnosis not present

## 2019-08-22 DIAGNOSIS — L821 Other seborrheic keratosis: Secondary | ICD-10-CM

## 2019-08-22 DIAGNOSIS — D492 Neoplasm of unspecified behavior of bone, soft tissue, and skin: Secondary | ICD-10-CM

## 2019-08-22 DIAGNOSIS — D2330 Other benign neoplasm of skin of unspecified part of face: Secondary | ICD-10-CM

## 2019-08-22 DIAGNOSIS — Q825 Congenital non-neoplastic nevus: Secondary | ICD-10-CM

## 2019-08-22 DIAGNOSIS — L578 Other skin changes due to chronic exposure to nonionizing radiation: Secondary | ICD-10-CM

## 2019-08-22 NOTE — Progress Notes (Signed)
New Patient Visit  Subjective  Tiffany [name] is a 54 y.o. female who presents for the following: Annual Exam (Total body skin exam) and growths to check (face, R arm, L leg, one on face she hits and bleeds). The patient presents for Total-Body Skin Exam (TBSE) for skin cancer screening and mole check.  The following portions of the chart were reviewed this encounter and updated as appropriate:  Tobacco  Allergies  Meds  Problems  Med Hx  Surg Hx  Fam Hx     Review of Systems:  No other skin or systemic complaints except as noted in HPI or Assessment and Plan.  Objective  Well appearing patient in no apparent distress; mood and affect are within normal limits.  A full examination was performed including scalp, head, eyes, ears, nose, lips, neck, chest, axillae, abdomen, back, buttocks, bilateral upper extremities, bilateral lower extremities, hands, feet, fingers, toes, fingernails, and toenails. All findings within normal limits unless otherwise noted below.  Objective  R temple at lateral canthus: 0.5cm verrucous pap     Objective  Left foot dorsum: 0.4cm irregular brown macule     Objective  face: Erythema and  telangiectasias face  Objective  nose, nasal tip: Flesh paps  R oral commissure  Objective  R oral commissure: Flesh pap  Objective  Right Elbow x 1, R  calf x 2 (3): Erythematous keratotic or waxy stuck-on papule or plaque.   Objective  Right Thigh - Anterior: Flat pap 0.7 x 0.5cm  Images     Assessment & Plan    Seborrheic Keratoses - Stuck-on, waxy, tan-brown papules and plaques  - Discussed benign etiology and prognosis. - Observe - Call for any changes  Melanocytic Nevi - Tan-brown and/or pink-flesh-colored symmetric macules and papules - Benign appearing on exam today - Observation - Call clinic for new or changing moles - Recommend daily use of broad spectrum spf 30+ sunscreen to sun-exposed areas.   Hemangiomas -  Red papules - Discussed benign nature - Observe - Call for any changes  Actinic Damage - diffuse scaly erythematous macules with underlying dyspigmentation - Recommend daily broad spectrum sunscreen SPF 30+ to sun-exposed areas, reapply every 2 hours as needed.  - Call for new or changing lesions.  Skin cancer screening performed today.  Neoplasm of skin (2) R temple at lateral canthus Biopsy performed after consent. Specimen 1 - Surgical pathology Differential Diagnosis: D48.5 Wart vs ISK vs other Check Margins: yes Verrucous pap 0.5cm  Left foot dorsum Biopsy performed after consent. Specimen 2 - Surgical pathology Differential Diagnosis: Nevus vs Dysplastic Nevus Check Margins: yes 0.4cm irregular brown macule  Rosacea, Erythrotelangiectatic type face Benign, Discussed BBL, $350 per txt session  Fibrous papule of face nose, nasal tip Benign, observe.    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  I, Othelia Pulling, RMA, am acting as scribe for Sarina Ser, MD .  Documentation: I have reviewed the above documentation for accuracy and completeness, and I agree with the above.  Sarina Ser, MD

## 2019-08-22 NOTE — Patient Instructions (Signed)

## 2019-08-27 ENCOUNTER — Telehealth: Payer: Self-pay

## 2019-08-27 NOTE — Telephone Encounter (Signed)
-----   Message from Ralene Bathe, MD sent at 08/27/2019 10:18 AM EDT ----- 1. Skin , right temple at lateral canthus VERRUCA VULGARIS, IRRITATED 2. 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

## 2019-08-27 NOTE — Telephone Encounter (Signed)
Opened in error

## 2019-08-27 NOTE — Telephone Encounter (Signed)
Patient informed of pathology results 

## 2019-08-28 ENCOUNTER — Encounter: Payer: Self-pay | Admitting: Dermatology

## 2019-09-03 IMAGING — US US ABDOMEN LIMITED
1 series · 14 of 25 positions shown · non-contrast
Comparison: None.

CLINICAL DATA: Right upper quadrant abdominal pain

EXAM:
ULTRASOUND ABDOMEN LIMITED RIGHT UPPER QUADRANT

[Series 1: us abdomen limited · 14 of 38 slices shown]
[im 1/38]
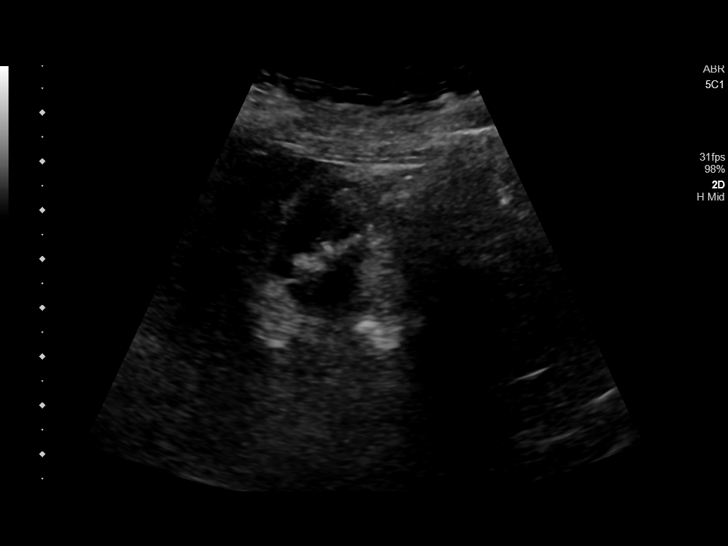
[im 4/38]
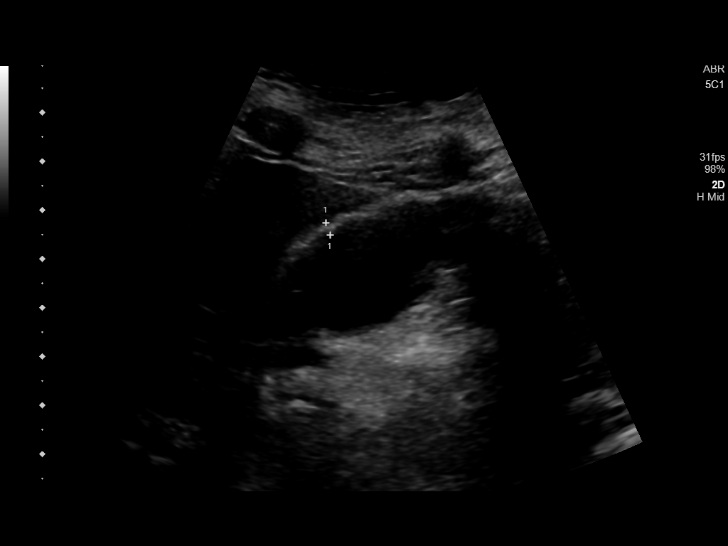
[im 7/38]
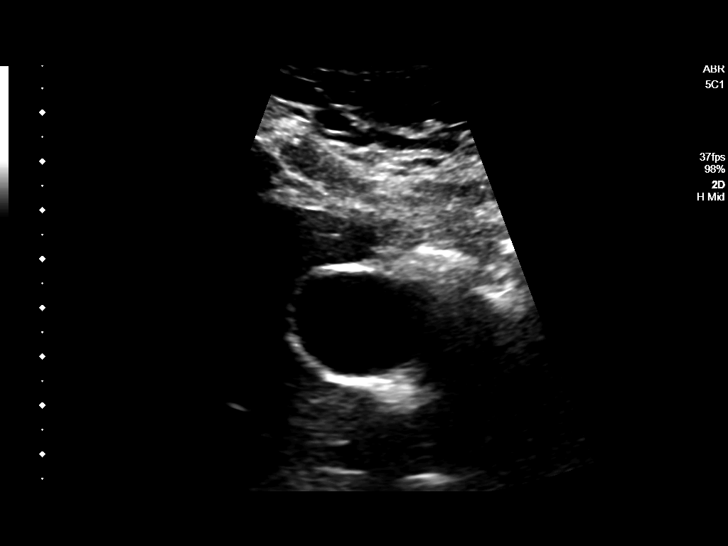
[im 10/38]
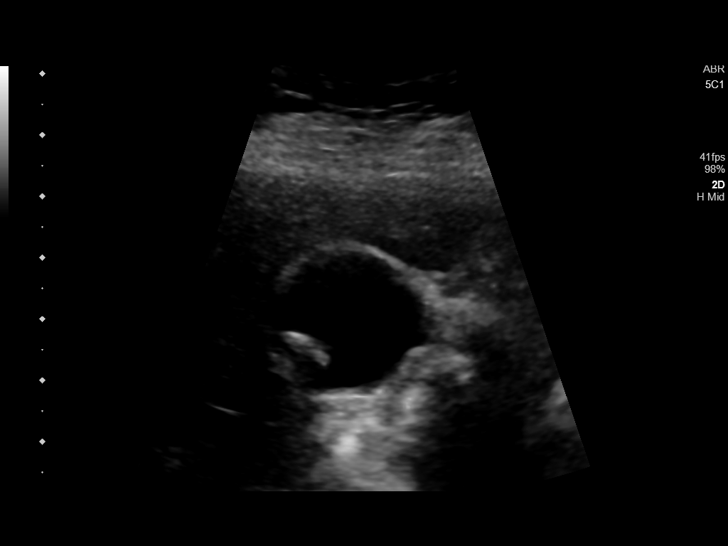
[im 13/38]
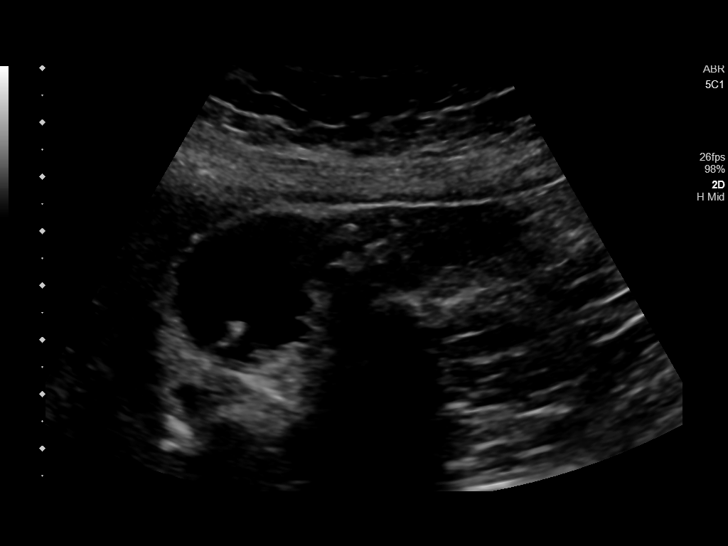
[im 14/38]
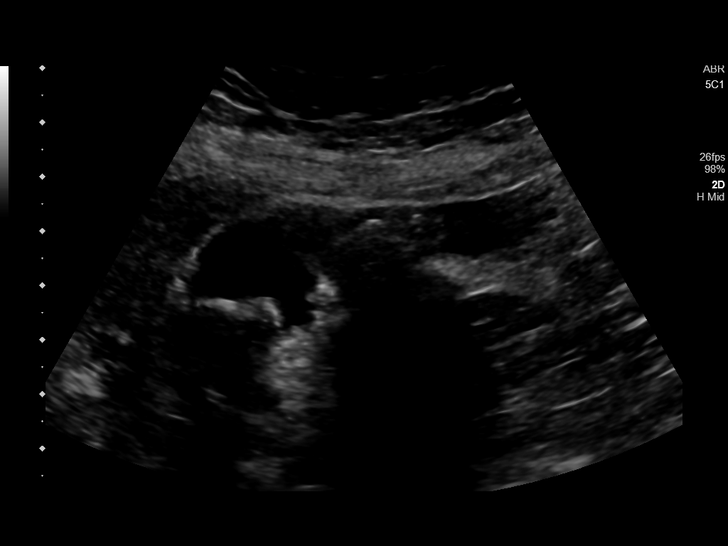
[im 17/38]
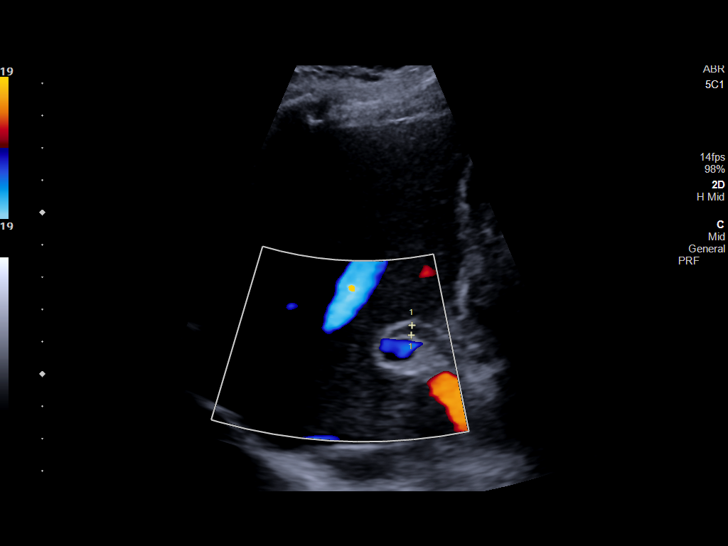
[im 21/38]
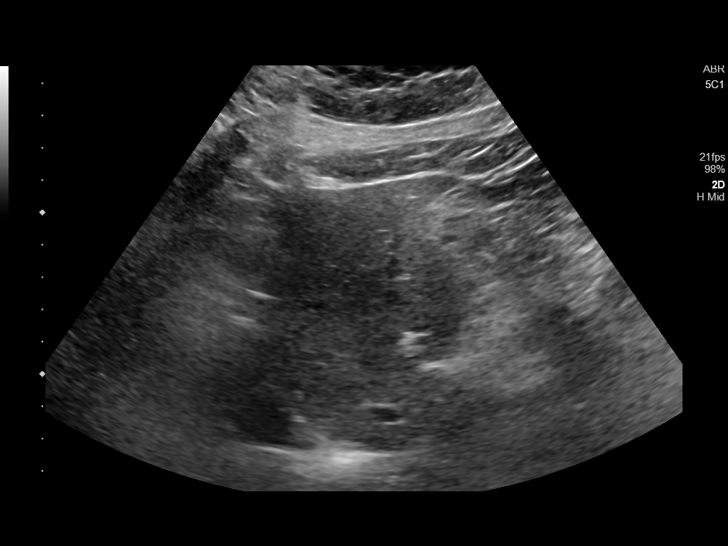
[im 24/38]
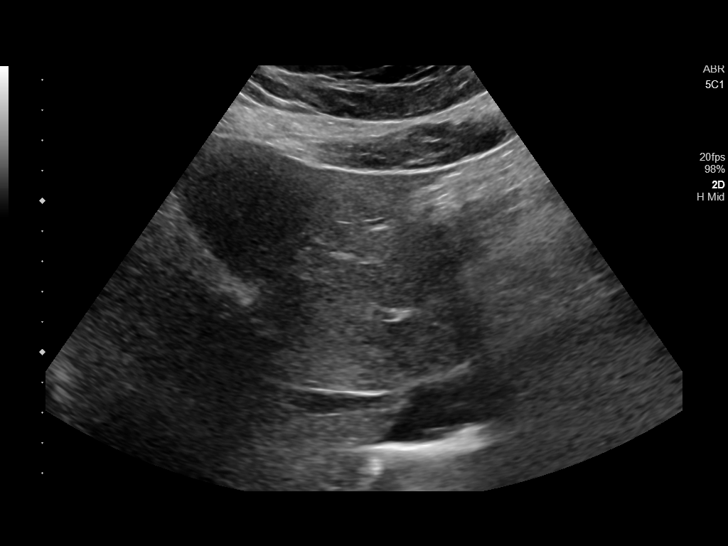
[im 25/38]
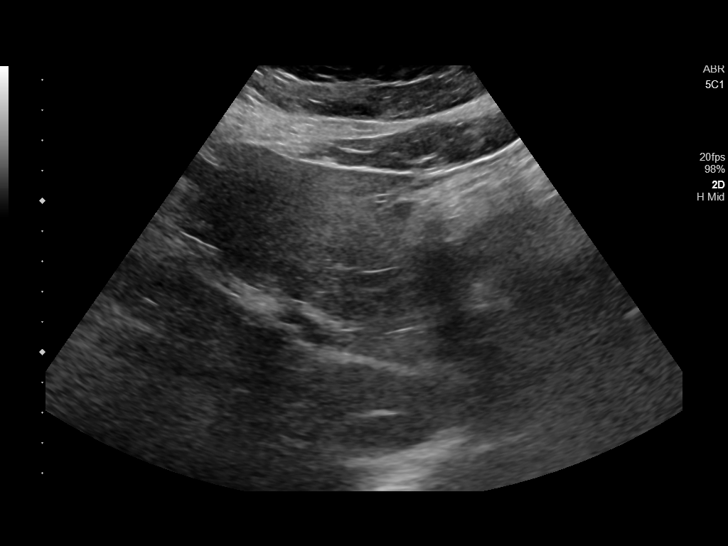
[im 28/38]
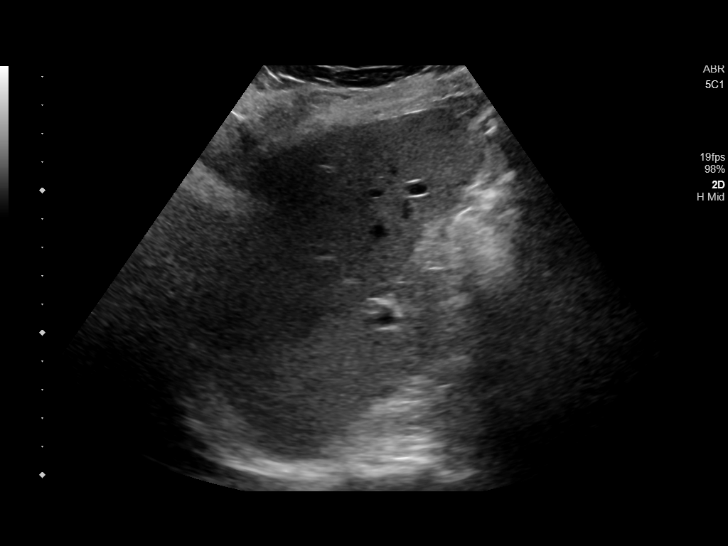
[im 31/38]
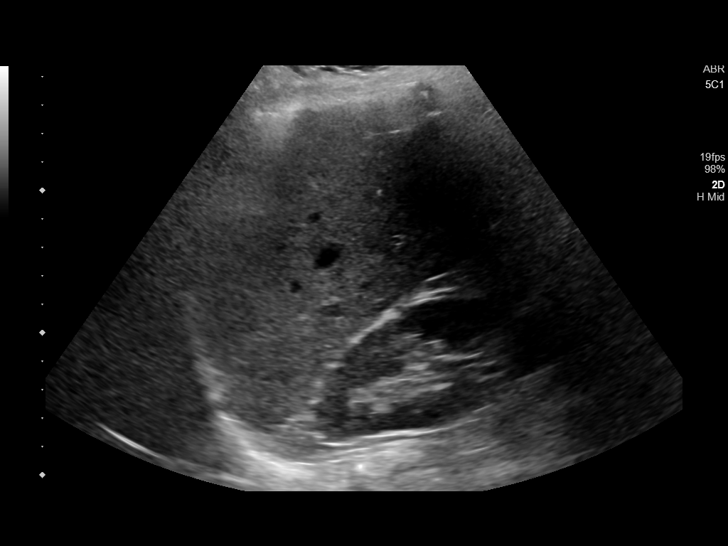
[im 34/38]
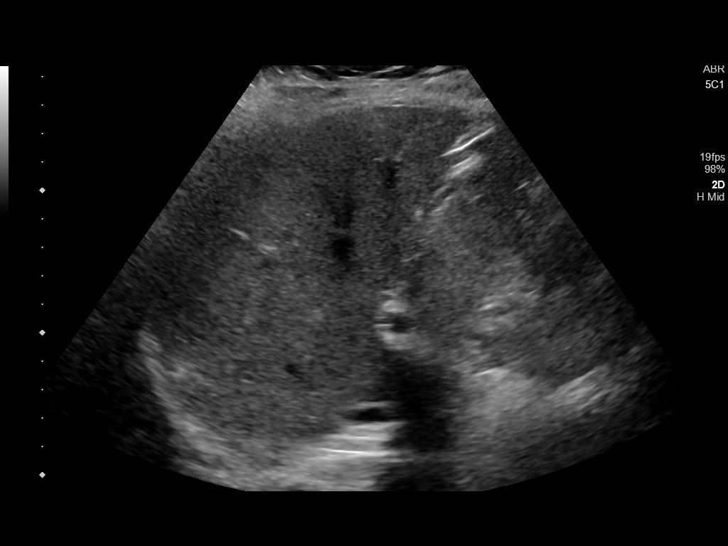
[im 38/38]
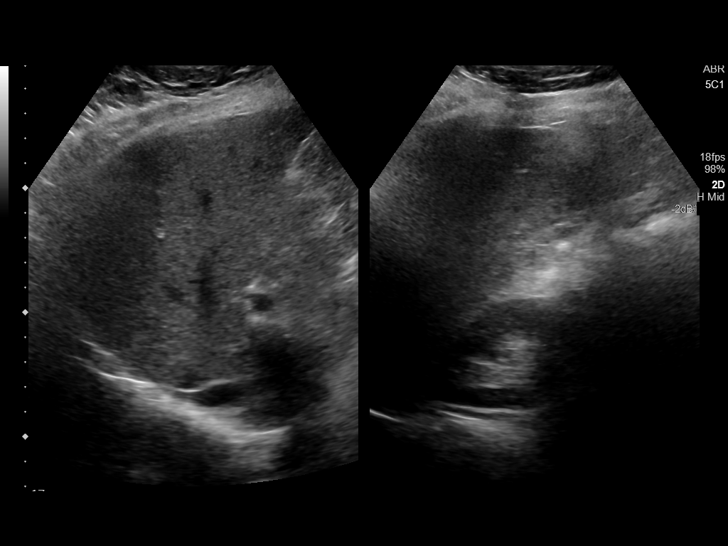

[14 of 25 positions shown; findings below may reference images not displayed]

FINDINGS: Gallbladder:

Shadowing stones within the gallbladder. Wall thickness upper
normal. Negative sonographic Murphy.

Common bile duct:

Diameter: 3 mm

Liver:

Mild increased hepatic echogenicity without focal abnormality.
Portal vein is patent on color Doppler imaging with normal direction
of blood flow towards the liver.
IMPRESSION: 1. Cholelithiasis without sonographic evidence for acute
cholecystitis or biliary dilatation
2. Increased hepatic echogenicity suggests steatosis.

## 2019-09-03 IMAGING — NM NM HEPATOBILIARY IMAGE, INC GB
4 series · 12 of 12 positions shown · non-contrast
Comparison: 04/13/2017 abdominal sonogram.

CLINICAL DATA: Right upper quadrant abdominal pain.

EXAM:
NUCLEAR MEDICINE HEPATOBILIARY IMAGING
TECHNIQUE: Sequential images of the abdomen were obtained [DATE] minutes
following intravenous administration of radiopharmaceutical. Delayed
images of the abdomen were obtained at 90, 120 and 240 minutes.
RADIOPHARMACEUTICALS:  5.6 mCi 0c-NNm  Choletec IV

[Series 1000: gallbladder delays · 3.30mm/px · 2 of 2 slices shown (1 of 2)]
[im 1/2]
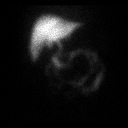
[im 2/2]
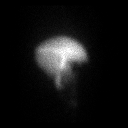

[Series 1000: gallbladder delays · 3.30mm/px · 2 of 2 slices shown (2 of 2)]
[im 1/2]
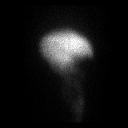
[im 2/2]
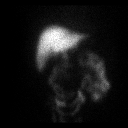

[Series 1000: 4 hr post inj gallbladder delays · 3.30mm/px · 2 of 2 slices shown]
[im 1/2]
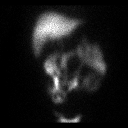
[im 2/2]
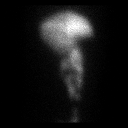

[Series 1000: hepatobiliary scan · 9.59mm/px · 6 of 60 frames shown]
[frame 6/60]
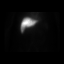
[frame 16/60]
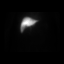
[frame 26/60]
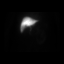
[frame 36/60]
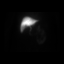
[frame 46/60]
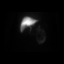
[frame 56/60]
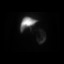

[12 of 12 positions shown; findings below may reference images not displayed]

FINDINGS: Prompt uptake and biliary excretion of activity by the liver is
seen. Biliary activity passes into small bowel, consistent with
patent common bile duct. There is mild gallbladder activity best
visualized on the 90 minutes delayed spot images, consistent with
patency of cystic duct.
IMPRESSION: Mild gallbladder activity is visualized on the delayed spot images,
consistent with patency of the cystic duct. Scintigraphic findings
are not compatible with acute cholecystitis.

Prompt tracer excretion into the small bowel consistent with patent
common bile duct.

These results will be called to the ordering clinician or
representative by the [HOSPITAL] at the imaging location.

## 2020-08-25 ENCOUNTER — Encounter: Payer: BC Managed Care – PPO | Admitting: Dermatology

## 2021-11-03 ENCOUNTER — Other Ambulatory Visit: Payer: Self-pay | Admitting: Internal Medicine

## 2021-11-03 DIAGNOSIS — Z1231 Encounter for screening mammogram for malignant neoplasm of breast: Secondary | ICD-10-CM

## 2021-11-05 ENCOUNTER — Ambulatory Visit
Admission: RE | Admit: 2021-11-05 | Discharge: 2021-11-05 | Disposition: A | Discharge status: Home / Self Care | Payer: 59 | Source: Ambulatory Visit | Attending: Internal Medicine | Admitting: Internal Medicine

## 2021-11-05 DIAGNOSIS — Z1231 Encounter for screening mammogram for malignant neoplasm of breast: Secondary | ICD-10-CM | POA: Diagnosis present

## 2021-11-10 ENCOUNTER — Other Ambulatory Visit: Payer: Self-pay | Admitting: *Deleted

## 2021-11-10 ENCOUNTER — Inpatient Hospital Stay
Admission: RE | Admit: 2021-11-10 | Discharge: 2021-11-10 | Disposition: A | Discharge status: Home / Self Care | Payer: Self-pay | Source: Ambulatory Visit | Attending: *Deleted | Admitting: *Deleted

## 2021-11-10 DIAGNOSIS — Z1231 Encounter for screening mammogram for malignant neoplasm of breast: Secondary | ICD-10-CM

## 2022-11-10 ENCOUNTER — Other Ambulatory Visit: Payer: Self-pay | Admitting: Internal Medicine

## 2022-11-10 DIAGNOSIS — Z1231 Encounter for screening mammogram for malignant neoplasm of breast: Secondary | ICD-10-CM

## 2022-12-21 ENCOUNTER — Ambulatory Visit
Admission: RE | Admit: 2022-12-21 | Discharge: 2022-12-21 | Disposition: A | Discharge status: Home / Self Care | Payer: 59 | Source: Ambulatory Visit | Attending: Internal Medicine | Admitting: Internal Medicine

## 2022-12-21 DIAGNOSIS — Z1231 Encounter for screening mammogram for malignant neoplasm of breast: Secondary | ICD-10-CM | POA: Diagnosis present

## 2023-01-19 ENCOUNTER — Other Ambulatory Visit: Payer: Self-pay | Admitting: Internal Medicine

## 2023-01-19 DIAGNOSIS — E782 Mixed hyperlipidemia: Secondary | ICD-10-CM

## 2023-01-19 DIAGNOSIS — Z0000 Encounter for general adult medical examination without abnormal findings: Secondary | ICD-10-CM

## 2023-01-31 ENCOUNTER — Other Ambulatory Visit: Payer: 59

## 2023-02-01 ENCOUNTER — Ambulatory Visit
Admission: RE | Admit: 2023-02-01 | Discharge: 2023-02-01 | Disposition: A | Discharge status: Home / Self Care | Payer: Self-pay | Source: Ambulatory Visit | Attending: Internal Medicine | Admitting: Internal Medicine

## 2023-02-01 DIAGNOSIS — E782 Mixed hyperlipidemia: Secondary | ICD-10-CM | POA: Insufficient documentation

## 2023-02-01 DIAGNOSIS — Z0000 Encounter for general adult medical examination without abnormal findings: Secondary | ICD-10-CM | POA: Insufficient documentation

## 2023-07-22 ENCOUNTER — Other Ambulatory Visit: Payer: Self-pay | Admitting: Internal Medicine

## 2023-07-22 DIAGNOSIS — R911 Solitary pulmonary nodule: Secondary | ICD-10-CM

## 2023-07-25 ENCOUNTER — Ambulatory Visit
Admission: RE | Admit: 2023-07-25 | Discharge: 2023-07-25 | Disposition: A | Discharge status: Home / Self Care | Source: Ambulatory Visit | Attending: Internal Medicine | Admitting: Internal Medicine

## 2023-07-25 ENCOUNTER — Encounter: Payer: Self-pay | Admitting: Internal Medicine

## 2023-07-25 ENCOUNTER — Inpatient Hospital Stay: Admission: RE | Admit: 2023-07-25 | Source: Ambulatory Visit

## 2023-07-25 DIAGNOSIS — R911 Solitary pulmonary nodule: Secondary | ICD-10-CM

## 2023-09-22 ENCOUNTER — Encounter: Payer: Self-pay | Admitting: Physician Assistant

## 2023-09-22 ENCOUNTER — Ambulatory Visit
Admission: EM | Admit: 2023-09-22 | Discharge: 2023-09-22 | Disposition: A | Discharge status: Home / Self Care | Attending: Physician Assistant | Admitting: Physician Assistant

## 2023-09-22 DIAGNOSIS — J069 Acute upper respiratory infection, unspecified: Secondary | ICD-10-CM | POA: Insufficient documentation

## 2023-09-22 DIAGNOSIS — R0981 Nasal congestion: Secondary | ICD-10-CM | POA: Insufficient documentation

## 2023-09-22 DIAGNOSIS — H9203 Otalgia, bilateral: Secondary | ICD-10-CM | POA: Diagnosis not present

## 2023-09-22 HISTORY — DX: Type 2 diabetes mellitus without complications: E11.9

## 2023-09-22 LAB — SARS CORONAVIRUS 2 BY RT PCR: SARS Coronavirus 2 by RT PCR: NEGATIVE

## 2023-09-22 MED ORDER — PSEUDOEPH-BROMPHEN-DM 30-2-10 MG/5ML PO SYRP
10.0000 mL | ORAL_SOLUTION | Freq: Four times a day (QID) | ORAL | 0 refills | Status: AC | PRN
Start: 2023-09-22 — End: 2023-09-29

## 2023-09-22 MED ORDER — IPRATROPIUM BROMIDE 0.06 % NA SOLN: 2.0000 | Freq: Four times a day (QID) | NASAL | 0 refills | Status: AC

## 2023-09-22 NOTE — ED Provider Notes (Signed)
 [facility]    CSN: [id number] Arrival date & time: 09/22/23  0810      History   Chief Complaint Chief Complaint  Patient presents with   Cough   Nasal Congestion    HPI Tiffany [name] is a 58 y.o. female presenting for feeling feverish with fatigue, bilateral ear pain, scratchy throat, slight cough, nasal and congestion x 2.5 days.  Denies recorded fever, ear pain, sinus pain, chest pain, wheezing, shortness of breath, abdominal pain, vomiting or diarrhea.  Patient had a flu shot the morning of onset of symptoms. Patient has been taking over-the-counter decongestants. No other complaints.  HPI  Past Medical History:  Diagnosis Date   Abdominal pain 04/13/2017   Diabetes mellitus without complication (HCC)    Dysplastic nevus 08/21/2020   left dorsum foot    There are no active problems to display for this patient.   Past Surgical History:  Procedure Laterality Date   CESAREAN SECTION     CHOLECYSTECTOMY N/A 04/20/2017   Procedure: LAPAROSCOPIC CHOLECYSTECTOMY;  Surgeon: Rodolph Romano, MD;  Location: [facility] ORS;  Service: General;  Laterality: N/A;   COLONOSCOPY     ERCP  04/18/2017    OB History   No obstetric history on file.      Home Medications    Prior to Admission medications   Medication Sig Start Date End Date Taking? Authorizing Provider  brompheniramine-pseudoephedrine-DM 30-2-10 MG/5ML syrup Take 10 mLs by mouth 4 (four) times daily as needed for up to 7 days. 09/22/23 09/29/23 Yes Arvis Huxley B, PA-C  ipratropium (ATROVENT ) 0.06 % nasal spray Place 2 sprays into both nostrils 4 (four) times daily. 09/22/23  Yes Arvis Huxley NOVAK, PA-C  metFORMIN (GLUCOPHAGE-XR) 750 MG 24 hr tablet Take 1,500 mg by mouth. 01/18/23  Yes [provider]  MOUNJARO 5 MG/0.5ML Pen SMARTSIG:0.5 Milliliter(s) SUB-Q Once a Week 09/11/23  Yes [provider]    Family History Family History  Problem Relation Age of Onset   Breast cancer  Mother 40    Social History Social History   Tobacco Use   Smoking status: Never   Smokeless tobacco: Never  Vaping Use   Vaping status: Never Used  Substance Use Topics   Alcohol use: Yes    Comment: OCC   Drug use: Never     Allergies   Patient has no known allergies.   Review of Systems Review of Systems  Constitutional:  Positive for fatigue. Negative for chills, diaphoresis and fever.  HENT:  Positive for congestion, ear pain, rhinorrhea and sore throat. Negative for sinus pressure and sinus pain.   Respiratory:  Positive for cough. Negative for shortness of breath.   Cardiovascular:  Negative for chest pain.  Gastrointestinal:  Negative for abdominal pain, nausea and vomiting.  Musculoskeletal:  Negative for arthralgias and myalgias.  Skin:  Negative for rash.  Neurological:  Negative for weakness and headaches.  Hematological:  Negative for adenopathy.     Physical Exam Triage Vital Signs ED Triage Vitals  Encounter Vitals Group     BP      Girls Systolic BP Percentile      Girls Diastolic BP Percentile      Boys Systolic BP Percentile      Boys Diastolic BP Percentile      Pulse      Resp      Temp      Temp src      SpO2  Weight      Height      Head Circumference      Peak Flow      Pain Score      Pain Loc      Pain Education      Exclude from Growth Chart    No data found.  Updated Vital Signs BP 110/72 (BP Location: Left Arm)   Pulse 94   Temp 98.7 F (37.1 C) (Oral)   Resp 18   Wt 160 lb (72.6 kg)   SpO2 100%   BMI 27.46 kg/m      Physical Exam Vitals and nursing note reviewed.  Constitutional:      General: She is not in acute distress.    Appearance: Normal appearance. She is not ill-appearing or toxic-appearing.  HENT:     Head: Normocephalic and atraumatic.     Right Ear: Tympanic membrane, ear canal and external ear normal.     Left Ear: Tympanic membrane, ear canal and external ear normal.     Nose: Congestion  present.     Mouth/Throat:     Mouth: Mucous membranes are moist.     Pharynx: Oropharynx is clear. Posterior oropharyngeal erythema present.  Eyes:     General: No scleral icterus.       Right eye: No discharge.        Left eye: No discharge.     Conjunctiva/sclera: Conjunctivae normal.  Cardiovascular:     Rate and Rhythm: Normal rate and regular rhythm.     Heart sounds: Normal heart sounds.  Pulmonary:     Effort: Pulmonary effort is normal. No respiratory distress.     Breath sounds: Normal breath sounds.  Musculoskeletal:     Cervical back: Neck supple.  Skin:    General: Skin is dry.  Neurological:     General: No focal deficit present.     Mental Status: She is alert. Mental status is at baseline.     Motor: No weakness.     Gait: Gait normal.  Psychiatric:        Mood and Affect: Mood normal.        Behavior: Behavior normal.      UC Treatments / Results  Labs (all labs ordered are listed, but only abnormal results are displayed) Labs Reviewed  SARS CORONAVIRUS 2 BY RT PCR    EKG   Radiology No results found.  Procedures Procedures (including critical care time)  Medications Ordered in UC Medications - No data to display  Initial Impression / Assessment and Plan / UC Course  I have reviewed the triage vital signs and the nursing notes.  Pertinent labs & imaging results that were available during my care of the patient were reviewed by me and considered in my medical decision making (see chart for details).   58 year old female presents for 2-1/2-day history of fatigue, bilateral ear pressure, scratchy throat, nasal congestion and slight cough.  Symptoms started the same day she had a flu shot.  She is also concerned for COVID-19 since it is going around the area.  Vitals are stable and normal.  Overall well-appearing.  No acute distress.  No evidence of ear infection.  Slight nasal congestion.  Mild posterior pharyngeal erythema and postnasal  drainage.  Chest clear.  Heart regular rate rhythm.  COVID test obtained. Negative.   Viral URI. Sent Bromfed DM and Atrovent  nasal spray. Reviewed supportive care. Discussed return precautions.    Final Clinical Impressions(s) /  UC Diagnoses   Final diagnoses:  Viral upper respiratory tract infection  Nasal congestion  Acute ear pain, bilateral     Discharge Instructions      -Negative COVID test -Viral illness vs symptoms related to flu shot. Should improve in a couple days  URI/COLD SYMPTOMS: Your exam today is consistent with a viral illness. Antibiotics are not indicated at this time. Use medications as directed, including cough syrup, nasal saline, and decongestants. Your symptoms should improve over the next few days and resolve within 7-10 days. Increase rest and fluids. F/u if symptoms worsen or predominate such as sore throat, ear pain, productive cough, shortness of breath, or if you develop high fevers or worsening fatigue over the next several days.       ED Prescriptions     Medication Sig Dispense Auth. Provider   brompheniramine-pseudoephedrine-DM 30-2-10 MG/5ML syrup Take 10 mLs by mouth 4 (four) times daily as needed for up to 7 days. 150 mL Arvis Huxley B, PA-C   ipratropium (ATROVENT ) 0.06 % nasal spray Place 2 sprays into both nostrils 4 (four) times daily. 15 mL Arvis Huxley NOVAK, PA-C      PDMP not reviewed this encounter.   Arvis Huxley NOVAK, PA-C 09/22/23 [phone]

## 2023-09-22 NOTE — ED Triage Notes (Signed)
 Headache, ear pain, scratchy throat, cough, congestion x 3 days. Taking tylenol  and nyquil.   Pt states she got a flu shot on Monday, doesn't know if it may be covid or symptoms from flu shot.

## 2023-09-22 NOTE — Discharge Instructions (Addendum)
-  Negative COVID test -Viral illness vs symptoms related to flu shot. Should improve in a couple days  URI/COLD SYMPTOMS: Your exam today is consistent with a viral illness. Antibiotics are not indicated at this time. Use medications as directed, including cough syrup, nasal saline, and decongestants. Your symptoms should improve over the next few days and resolve within 7-10 days. Increase rest and fluids. F/u if symptoms worsen or predominate such as sore throat, ear pain, productive cough, shortness of breath, or if you develop high fevers or worsening fatigue over the next several days.

## 2024-02-02 ENCOUNTER — Other Ambulatory Visit: Payer: Self-pay | Admitting: Internal Medicine

## 2024-02-02 DIAGNOSIS — Z1231 Encounter for screening mammogram for malignant neoplasm of breast: Secondary | ICD-10-CM

## 2024-03-12 ENCOUNTER — Ambulatory Visit
